# Patient Record
Sex: Female | Born: 1960 | Race: White | Hispanic: No | Marital: Married | Smoking: Former smoker
Health system: Southern US, Community
[De-identification: ages and names within clinical notes are randomized; demographics above are authoritative.]

## PROBLEM LIST (undated history)

## (undated) DIAGNOSIS — T7840XA Allergy, unspecified, initial encounter: Secondary | ICD-10-CM

## (undated) DIAGNOSIS — K219 Gastro-esophageal reflux disease without esophagitis: Secondary | ICD-10-CM

## (undated) DIAGNOSIS — W5911XA Bitten by nonvenomous snake, initial encounter: Secondary | ICD-10-CM

## (undated) HISTORY — DX: Gastro-esophageal reflux disease without esophagitis: K21.9

## (undated) HISTORY — DX: Allergy, unspecified, initial encounter: T78.40XA

## (undated) HISTORY — DX: Bitten by nonvenomous snake, initial encounter: W59.11XA

## (undated) HISTORY — PX: DILATION AND CURETTAGE OF UTERUS: SHX78

---

## 1999-09-30 ENCOUNTER — Other Ambulatory Visit: Admission: RE | Admit: 1999-09-30 | Discharge: 1999-09-30 | Payer: Self-pay | Admitting: Obstetrics & Gynecology

## 2000-04-07 ENCOUNTER — Inpatient Hospital Stay (HOSPITAL_COMMUNITY): Admission: AD | Admit: 2000-04-07 | Discharge: 2000-04-10 | Payer: Self-pay | Admitting: Obstetrics & Gynecology

## 2000-05-15 ENCOUNTER — Other Ambulatory Visit: Admission: RE | Admit: 2000-05-15 | Discharge: 2000-05-15 | Payer: Self-pay | Admitting: Obstetrics & Gynecology

## 2001-08-15 ENCOUNTER — Other Ambulatory Visit: Admission: RE | Admit: 2001-08-15 | Discharge: 2001-08-15 | Payer: Self-pay | Admitting: Obstetrics & Gynecology

## 2002-10-09 ENCOUNTER — Other Ambulatory Visit: Admission: RE | Admit: 2002-10-09 | Discharge: 2002-10-09 | Payer: Self-pay | Admitting: Obstetrics & Gynecology

## 2007-10-28 ENCOUNTER — Emergency Department (HOSPITAL_COMMUNITY): Admission: EM | Admit: 2007-10-28 | Discharge: 2007-10-28 | Payer: Self-pay | Admitting: Emergency Medicine

## 2007-11-04 ENCOUNTER — Encounter: Admission: RE | Admit: 2007-11-04 | Discharge: 2007-11-04 | Payer: Self-pay | Admitting: Orthopedic Surgery
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
## 2009-09-28 ENCOUNTER — Other Ambulatory Visit: Admission: RE | Admit: 2009-09-28 | Discharge: 2009-09-28 | Payer: Self-pay | Admitting: Obstetrics and Gynecology

## 2010-12-05 ENCOUNTER — Other Ambulatory Visit: Payer: Self-pay | Admitting: Family Medicine

## 2010-12-05 DIAGNOSIS — Z1231 Encounter for screening mammogram for malignant neoplasm of breast: Secondary | ICD-10-CM

## 2010-12-15 ENCOUNTER — Ambulatory Visit
Admission: RE | Admit: 2010-12-15 | Discharge: 2010-12-15 | Disposition: A | Discharge status: Home / Self Care | Payer: 59 | Source: Ambulatory Visit | Attending: Family Medicine | Admitting: Family Medicine

## 2010-12-15 DIAGNOSIS — Z1231 Encounter for screening mammogram for malignant neoplasm of breast: Secondary | ICD-10-CM

## 2013-06-20 ENCOUNTER — Other Ambulatory Visit: Payer: Self-pay

## 2013-06-20 DIAGNOSIS — Z1231 Encounter for screening mammogram for malignant neoplasm of breast: Secondary | ICD-10-CM

## 2013-07-22 ENCOUNTER — Ambulatory Visit: Payer: 59

## 2013-08-22 ENCOUNTER — Ambulatory Visit
Admission: RE | Admit: 2013-08-22 | Discharge: 2013-08-22 | Disposition: A | Discharge status: Home / Self Care | Payer: Managed Care, Other (non HMO) | Source: Ambulatory Visit

## 2013-08-22 DIAGNOSIS — Z1231 Encounter for screening mammogram for malignant neoplasm of breast: Secondary | ICD-10-CM

## 2014-08-17 ENCOUNTER — Other Ambulatory Visit: Payer: Self-pay

## 2014-08-17 DIAGNOSIS — Z1231 Encounter for screening mammogram for malignant neoplasm of breast: Secondary | ICD-10-CM

## 2014-08-26 ENCOUNTER — Ambulatory Visit
Admission: RE | Admit: 2014-08-26 | Discharge: 2014-08-26 | Disposition: A | Discharge status: Home / Self Care | Payer: 59 | Source: Ambulatory Visit

## 2014-08-26 DIAGNOSIS — Z1231 Encounter for screening mammogram for malignant neoplasm of breast: Secondary | ICD-10-CM

## 2015-09-09 ENCOUNTER — Other Ambulatory Visit: Payer: Self-pay

## 2015-09-09 DIAGNOSIS — Z1231 Encounter for screening mammogram for malignant neoplasm of breast: Secondary | ICD-10-CM

## 2015-09-17 ENCOUNTER — Ambulatory Visit
Admission: RE | Admit: 2015-09-17 | Discharge: 2015-09-17 | Disposition: A | Discharge status: Home / Self Care | Payer: 59 | Source: Ambulatory Visit

## 2015-09-17 DIAGNOSIS — Z1231 Encounter for screening mammogram for malignant neoplasm of breast: Secondary | ICD-10-CM

## 2015-09-21 ENCOUNTER — Other Ambulatory Visit: Payer: Self-pay | Admitting: Family Medicine

## 2015-09-21 DIAGNOSIS — R928 Other abnormal and inconclusive findings on diagnostic imaging of breast: Secondary | ICD-10-CM

## 2015-09-23 ENCOUNTER — Other Ambulatory Visit: Payer: Self-pay | Admitting: Family Medicine

## 2015-09-23 DIAGNOSIS — R928 Other abnormal and inconclusive findings on diagnostic imaging of breast: Secondary | ICD-10-CM

## 2015-09-24 ENCOUNTER — Other Ambulatory Visit: Payer: 59

## 2015-09-27 ENCOUNTER — Encounter: Payer: Self-pay | Admitting: Family Medicine

## 2015-09-27 ENCOUNTER — Ambulatory Visit (INDEPENDENT_AMBULATORY_CARE_PROVIDER_SITE_OTHER): Payer: 59 | Admitting: Family Medicine

## 2015-09-27 VITALS — BP 102/70 | HR 73 | Temp 97.8°F | Resp 20 | Ht 67.0 in | Wt 160.2 lb

## 2015-09-27 DIAGNOSIS — R928 Other abnormal and inconclusive findings on diagnostic imaging of breast: Secondary | ICD-10-CM | POA: Insufficient documentation

## 2015-09-27 DIAGNOSIS — Z7189 Other specified counseling: Secondary | ICD-10-CM | POA: Diagnosis not present

## 2015-09-27 DIAGNOSIS — Z7689 Persons encountering health services in other specified circumstances: Secondary | ICD-10-CM | POA: Insufficient documentation

## 2015-09-27 NOTE — Patient Instructions (Signed)
Health Maintenance, Female Adopting a healthy lifestyle and getting preventive care can go a long way to promote health and wellness. Talk with your health care provider about what schedule of regular examinations is right for you. This is a good chance for you to check in with your provider about disease prevention and staying healthy. In between checkups, there are plenty of things you can do on your own. Experts have done a lot of research about which lifestyle changes and preventive measures are most likely to keep you healthy. Ask your health care provider for more information. WEIGHT AND DIET  Eat a healthy diet  Be sure to include plenty of vegetables, fruits, low-fat dairy products, and lean protein.  Do not eat a lot of foods high in solid fats, added sugars, or salt.  Get regular exercise. This is one of the most important things you can do for your health.  Most adults should exercise for at least 150 minutes each week. The exercise should increase your heart rate and make you sweat (moderate-intensity exercise).  Most adults should also do strengthening exercises at least twice a week. This is in addition to the moderate-intensity exercise.  Maintain a healthy weight  Body mass index (BMI) is a measurement that can be used to identify possible weight problems. It estimates body fat based on height and weight. Your health care provider can help determine your BMI and help you achieve or maintain a healthy weight.  For females 20 years of age and older:   A BMI below 18.5 is considered underweight.  A BMI of 18.5 to 24.9 is normal.  A BMI of 25 to 29.9 is considered overweight.  A BMI of 30 and above is considered obese.  Watch levels of cholesterol and blood lipids  You should start having your blood tested for lipids and cholesterol at 55 years of age, then have this test every 5 years.  You may need to have your cholesterol levels checked more often if:  Your lipid  or cholesterol levels are high.  You are older than 55 years of age.  You are at high risk for heart disease.  CANCER SCREENING   Lung Cancer  Lung cancer screening is recommended for adults 55-80 years old who are at high risk for lung cancer because of a history of smoking.  A yearly low-dose CT scan of the lungs is recommended for people who:  Currently smoke.  Have quit within the past 15 years.  Have at least a 30-pack-year history of smoking. A pack year is smoking an average of one pack of cigarettes a day for 1 year.  Yearly screening should continue until it has been 15 years since you quit.  Yearly screening should stop if you develop a health problem that would prevent you from having lung cancer treatment.  Breast Cancer  Practice breast self-awareness. This means understanding how your breasts normally appear and feel.  It also means doing regular breast self-exams. Let your health care provider know about any changes, no matter how small.  If you are in your 20s or 30s, you should have a clinical breast exam (CBE) by a health care provider every 1-3 years as part of a regular health exam.  If you are 40 or older, have a CBE every year. Also consider having a breast X-ray (mammogram) every year.  If you have a family history of breast cancer, talk to your health care provider about genetic screening.  If you   are at high risk for breast cancer, talk to your health care provider about having an MRI and a mammogram every year.  Breast cancer gene (BRCA) assessment is recommended for women who have family members with BRCA-related cancers. BRCA-related cancers include:  Breast.  Ovarian.  Tubal.  Peritoneal cancers.  Results of the assessment will determine the need for genetic counseling and BRCA1 and BRCA2 testing. Cervical Cancer Your health care provider may recommend that you be screened regularly for cancer of the pelvic organs (ovaries, uterus, and  vagina). This screening involves a pelvic examination, including checking for microscopic changes to the surface of your cervix (Pap test). You may be encouraged to have this screening done every 3 years, beginning at age 21.  For women ages 30-65, health care providers may recommend pelvic exams and Pap testing every 3 years, or they may recommend the Pap and pelvic exam, combined with testing for human papilloma virus (HPV), every 5 years. Some types of HPV increase your risk of cervical cancer. Testing for HPV may also be done on women of any age with unclear Pap test results.  Other health care providers may not recommend any screening for nonpregnant women who are considered low risk for pelvic cancer and who do not have symptoms. Ask your health care provider if a screening pelvic exam is right for you.  If you have had past treatment for cervical cancer or a condition that could lead to cancer, you need Pap tests and screening for cancer for at least 20 years after your treatment. If Pap tests have been discontinued, your risk factors (such as having a new sexual partner) need to be reassessed to determine if screening should resume. Some women have medical problems that increase the chance of getting cervical cancer. In these cases, your health care provider may recommend more frequent screening and Pap tests. Colorectal Cancer  This type of cancer can be detected and often prevented.  Routine colorectal cancer screening usually begins at 55 years of age and continues through 55 years of age.  Your health care provider may recommend screening at an earlier age if you have risk factors for colon cancer.  Your health care provider may also recommend using home test kits to check for hidden blood in the stool.  A small camera at the end of a tube can be used to examine your colon directly (sigmoidoscopy or colonoscopy). This is done to check for the earliest forms of colorectal  cancer.  Routine screening usually begins at age 50.  Direct examination of the colon should be repeated every 5-10 years through 55 years of age. However, you may need to be screened more often if early forms of precancerous polyps or small growths are found. Skin Cancer  Check your skin from head to toe regularly.  Tell your health care provider about any new moles or changes in moles, especially if there is a change in a mole's shape or color.  Also tell your health care provider if you have a mole that is larger than the size of a pencil eraser.  Always use sunscreen. Apply sunscreen liberally and repeatedly throughout the day.  Protect yourself by wearing long sleeves, pants, a wide-brimmed hat, and sunglasses whenever you are outside. HEART DISEASE, DIABETES, AND HIGH BLOOD PRESSURE   High blood pressure causes heart disease and increases the risk of stroke. High blood pressure is more likely to develop in:  People who have blood pressure in the high end   of the normal range (130-139/85-89 mm Hg).  People who are overweight or obese.  People who are African American.  If you are 38-23 years of age, have your blood pressure checked every 3-5 years. If you are 61 years of age or older, have your blood pressure checked every year. You should have your blood pressure measured twice--once when you are at a hospital or clinic, and once when you are not at a hospital or clinic. Record the average of the two measurements. To check your blood pressure when you are not at a hospital or clinic, you can use:  An automated blood pressure machine at a pharmacy.  A home blood pressure monitor.  If you are between 45 years and 39 years old, ask your health care provider if you should take aspirin to prevent strokes.  Have regular diabetes screenings. This involves taking a blood sample to check your fasting blood sugar level.  If you are at a normal weight and have a low risk for diabetes,  have this test once every three years after 55 years of age.  If you are overweight and have a high risk for diabetes, consider being tested at a younger age or more often. PREVENTING INFECTION  Hepatitis B  If you have a higher risk for hepatitis B, you should be screened for this virus. You are considered at high risk for hepatitis B if:  You were born in a country where hepatitis B is common. Ask your health care provider which countries are considered high risk.  Your parents were born in a high-risk country, and you have not been immunized against hepatitis B (hepatitis B vaccine).  You have HIV or AIDS.  You use needles to inject street drugs.  You live with someone who has hepatitis B.  You have had sex with someone who has hepatitis B.  You get hemodialysis treatment.  You take certain medicines for conditions, including cancer, organ transplantation, and autoimmune conditions. Hepatitis C  Blood testing is recommended for:  Everyone born from [birth year] through 1965.  Anyone with known risk factors for hepatitis C. Sexually transmitted infections (STIs)  You should be screened for sexually transmitted infections (STIs) including gonorrhea and chlamydia if:  You are sexually active and are younger than 55 years of age.  You are older than 55 years of age and your health care provider tells you that you are at risk for this type of infection.  Your sexual activity has changed since you were last screened and you are at an increased risk for chlamydia or gonorrhea. Ask your health care provider if you are at risk.  If you do not have HIV, but are at risk, it may be recommended that you take a prescription medicine daily to prevent HIV infection. This is called pre-exposure prophylaxis (PrEP). You are considered at risk if:  You are sexually active and do not regularly use condoms or know the HIV status of your partner(s).  You take drugs by injection.  You are sexually  active with a partner who has HIV. Talk with your health care provider about whether you are at high risk of being infected with HIV. If you choose to begin PrEP, you should first be tested for HIV. You should then be tested every 3 months for as long as you are taking PrEP.  PREGNANCY   If you are premenopausal and you may become pregnant, ask your health care provider about preconception counseling.  If you may  become pregnant, take 400 to 800 micrograms (mcg) of folic acid every day.  If you want to prevent pregnancy, talk to your health care provider about birth control (contraception). OSTEOPOROSIS AND MENOPAUSE   Osteoporosis is a disease in which the bones lose minerals and strength with aging. This can result in serious bone fractures. Your risk for osteoporosis can be identified using a bone density scan.  If you are 61 years of age or older, or if you are at risk for osteoporosis and fractures, ask your health care provider if you should be screened.  Ask your health care provider whether you should take a calcium or vitamin D supplement to lower your risk for osteoporosis.  Menopause may have certain physical symptoms and risks.  Hormone replacement therapy may reduce some of these symptoms and risks. Talk to your health care provider about whether hormone replacement therapy is right for you.  HOME CARE INSTRUCTIONS   Schedule regular health, dental, and eye exams.  Stay current with your immunizations.   Do not use any tobacco products including cigarettes, chewing tobacco, or electronic cigarettes.  If you are pregnant, do not drink alcohol.  If you are breastfeeding, limit how much and how often you drink alcohol.  Limit alcohol intake to no more than 1 drink per day for nonpregnant women. One drink equals 12 ounces of beer, 5 ounces of wine, or 1 ounces of hard liquor.  Do not use street drugs.  Do not share needles.  Ask your health care provider for help if  you need support or information about quitting drugs.  Tell your health care provider if you often feel depressed.  Tell your health care provider if you have ever been abused or do not feel safe at home.   This information is not intended to replace advice given to you by your health care provider. Make sure you discuss any questions you have with your health care provider.   Document Released: 02/13/2011 Document Revised: 08/21/2014 Document Reviewed: 07/02/2013 Elsevier Interactive Patient Education [employer].

## 2015-09-27 NOTE — Progress Notes (Addendum)
Patient ID: Anneth Brunell, female   DOB: Aug 24, 1960, 55 y.o.   MRN: [id number]      Patient ID: Story Conti, female  DOB: 11-Jun-1961, 55 y.o.   MRN: [id number]  Subjective:  Vastie Douty is a 55 y.o. female present for establishment of care with abnormal mammogram.  All past medical history, surgical history, allergies, family history, immunizations, medications and social history were obtained and entered in the electronic medical record today. All recent labs, ED visits and hospitalizations within the last year were reviewed.   Health maintenance:  Colonoscopy: 09/2010, polyps (5 year recommended) due this year ([facility]). Mammogram: Recent abnl, scheduled in 2 days for further eval on right breast. No prior irregular mammograms.  Cervical cancer screening: PAP 2014, Normal.  Immunizations: Flu (2016) and tdap UTD (2014) Infectious disease screening: indicated DEXA: routine screen 65   Past Medical History  Diagnosis Date  . Snake bite   . Allergy   . GERD (gastroesophageal reflux disease)    Allergies  Allergen Reactions  . Sulfamethoxazole Other (See Comments)    Unknown   Past Surgical History  Procedure Laterality Date  . Dilation and curettage of uterus     Family History  Problem Relation Age of Onset  . Alzheimer's disease Mother   . Heart disease Father   . Cancer Father     esophageal  . Diabetes Father   . Hyperlipidemia Brother   . Breast cancer Paternal Aunt    Social History   Social History  . Marital Status: Married    Spouse Name: N/A  . Number of Children: N/A  . Years of Education: N/A   Occupational History  . Not on file.   Social History Main Topics  . Smoking status: Former Games developer  . Smokeless tobacco: Not on file  . Alcohol Use: 1.2 - 1.8 oz/week    2-3 Glasses of wine per week  . Drug Use: No  . Sexual Activity: Yes    Birth Control/ Protection: Post-menopausal   Other Topics Concern  . Not on file   Social  History Narrative   Married to [employer]. # children Molli Hazard, Donna Christen)    Former nurse.    Former smoker. Occasional Wine. No drugs   Drinks caffeine.   Wears her seatbelt, wears bicycle helmet, Exercises 3x a week   Smoke detector in the home.    Feels safe in her relationships.    ROS: Negative, with the exception of above mentioned in HPI  Objective: BP 102/70 mmHg  Pulse 73  Temp(Src) 97.8 F (36.6 C)  Resp 20  Ht  (1.702 m)  Wt 160 lb 4 oz (72.689 kg)  BMI 25.09 kg/m2  SpO2 99%  LMP 11/23/2010 Gen: Afebrile. No acute distress. Nontoxic in appearance,  well-developed, well-nourished, female, pleasant  HENT: AT. [place]. . MMM, No Cough on exam Eyes:Pupils Equal Round Reactive to light, Extraocular movements intact,  Conjunctiva without redness, discharge or icterus. Neck/lymp/endocrine: Supple,No  lymphadenopathy, No thyromegaly CV: RRR no murmur, No edema, +2/4 P posterior tibialis pulses.  Chest: CTAB, no wheeze, rhonchi or crackles.  Abd: Soft. flat. NTND. BS present. No Masses palpated. No hepatosplenomegaly. No rebound tenderness or guarding. Skin: No rashes, purpura or petechiae.  Neuro/Msk: Normal gait. PERLA. EOMi. Alert. Oriented x3.   Psych: Normal affect, dress and demeanor. Normal speech. Normal thought content and judgment. Assessment/plan: Elexa Kivi is a 55 y.o. female  present to establish care.  Abnormal mammogram of right breast -  No prior ABNL mammogram.  - faxed orders this morning for [place].   F/U CPE/labs --> 3-4 months  No Follow-up on file.   Felix Pacini, DO [facility] [place]

## 2015-09-29 ENCOUNTER — Ambulatory Visit
Admission: RE | Admit: 2015-09-29 | Discharge: 2015-09-29 | Disposition: A | Discharge status: Home / Self Care | Payer: 59 | Source: Ambulatory Visit | Attending: Family Medicine | Admitting: Family Medicine

## 2015-09-29 DIAGNOSIS — R928 Other abnormal and inconclusive findings on diagnostic imaging of breast: Secondary | ICD-10-CM

## 2015-12-07 ENCOUNTER — Encounter: Payer: 59 | Admitting: Family Medicine

## 2015-12-27 ENCOUNTER — Encounter: Payer: Self-pay | Admitting: Family Medicine

## 2015-12-27 ENCOUNTER — Ambulatory Visit (INDEPENDENT_AMBULATORY_CARE_PROVIDER_SITE_OTHER): Payer: 59 | Admitting: Family Medicine

## 2015-12-27 VITALS — BP 109/74 | HR 76 | Temp 98.1°F | Resp 20 | Ht 67.0 in | Wt 152.8 lb

## 2015-12-27 DIAGNOSIS — Z1211 Encounter for screening for malignant neoplasm of colon: Secondary | ICD-10-CM

## 2015-12-27 DIAGNOSIS — Z13220 Encounter for screening for lipoid disorders: Secondary | ICD-10-CM

## 2015-12-27 DIAGNOSIS — Z86010 Personal history of colonic polyps: Secondary | ICD-10-CM | POA: Diagnosis not present

## 2015-12-27 DIAGNOSIS — Z1159 Encounter for screening for other viral diseases: Secondary | ICD-10-CM

## 2015-12-27 DIAGNOSIS — Z114 Encounter for screening for human immunodeficiency virus [HIV]: Secondary | ICD-10-CM

## 2015-12-27 DIAGNOSIS — Z130 Encounter for screening for diseases of the blood and blood-forming organs and certain disorders involving the immune mechanism: Secondary | ICD-10-CM

## 2015-12-27 DIAGNOSIS — Z1329 Encounter for screening for other suspected endocrine disorder: Secondary | ICD-10-CM

## 2015-12-27 DIAGNOSIS — Z0000 Encounter for general adult medical examination without abnormal findings: Secondary | ICD-10-CM | POA: Insufficient documentation

## 2015-12-27 DIAGNOSIS — B070 Plantar wart: Secondary | ICD-10-CM | POA: Diagnosis not present

## 2015-12-27 DIAGNOSIS — Z8249 Family history of ischemic heart disease and other diseases of the circulatory system: Secondary | ICD-10-CM

## 2015-12-27 DIAGNOSIS — Z131 Encounter for screening for diabetes mellitus: Secondary | ICD-10-CM

## 2015-12-27 NOTE — Patient Instructions (Signed)
Health Maintenance, Female Adopting a healthy lifestyle and getting preventive care can go a long way to promote health and wellness. Talk with your health care provider about what schedule of regular examinations is right for you. This is a good chance for you to check in with your provider about disease prevention and staying healthy. In between checkups, there are plenty of things you can do on your own. Experts have done a lot of research about which lifestyle changes and preventive measures are most likely to keep you healthy. Ask your health care provider for more information. WEIGHT AND DIET  Eat a healthy diet  Be sure to include plenty of vegetables, fruits, low-fat dairy products, and lean protein.  Do not eat a lot of foods high in solid fats, added sugars, or salt.  Get regular exercise. This is one of the most important things you can do for your health.  Most adults should exercise for at least 150 minutes each week. The exercise should increase your heart rate and make you sweat (moderate-intensity exercise).  Most adults should also do strengthening exercises at least twice a week. This is in addition to the moderate-intensity exercise.  Maintain a healthy weight  Body mass index (BMI) is a measurement that can be used to identify possible weight problems. It estimates body fat based on height and weight. Your health care provider can help determine your BMI and help you achieve or maintain a healthy weight.  For females 20 years of age and older:   A BMI below 18.5 is considered underweight.  A BMI of 18.5 to 24.9 is normal.  A BMI of 25 to 29.9 is considered overweight.  A BMI of 30 and above is considered obese.  Watch levels of cholesterol and blood lipids  You should start having your blood tested for lipids and cholesterol at 55 years of age, then have this test every 5 years.  You may need to have your cholesterol levels checked more often if:  Your lipid  or cholesterol levels are high.  You are older than 55 years of age.  You are at high risk for heart disease.  CANCER SCREENING   Lung Cancer  Lung cancer screening is recommended for adults 55-80 years old who are at high risk for lung cancer because of a history of smoking.  A yearly low-dose CT scan of the lungs is recommended for people who:  Currently smoke.  Have quit within the past 15 years.  Have at least a 30-pack-year history of smoking. A pack year is smoking an average of one pack of cigarettes a day for 1 year.  Yearly screening should continue until it has been 15 years since you quit.  Yearly screening should stop if you develop a health problem that would prevent you from having lung cancer treatment.  Breast Cancer  Practice breast self-awareness. This means understanding how your breasts normally appear and feel.  It also means doing regular breast self-exams. Let your health care provider know about any changes, no matter how small.  If you are in your 20s or 30s, you should have a clinical breast exam (CBE) by a health care provider every 1-3 years as part of a regular health exam.  If you are 40 or older, have a CBE every year. Also consider having a breast X-ray (mammogram) every year.  If you have a family history of breast cancer, talk to your health care provider about genetic screening.  If you   are at high risk for breast cancer, talk to your health care provider about having an MRI and a mammogram every year.  Breast cancer gene (BRCA) assessment is recommended for women who have family members with BRCA-related cancers. BRCA-related cancers include:  Breast.  Ovarian.  Tubal.  Peritoneal cancers.  Results of the assessment will determine the need for genetic counseling and BRCA1 and BRCA2 testing. Cervical Cancer Your health care provider may recommend that you be screened regularly for cancer of the pelvic organs (ovaries, uterus, and  vagina). This screening involves a pelvic examination, including checking for microscopic changes to the surface of your cervix (Pap test). You may be encouraged to have this screening done every 3 years, beginning at age 21.  For women ages 30-65, health care providers may recommend pelvic exams and Pap testing every 3 years, or they may recommend the Pap and pelvic exam, combined with testing for human papilloma virus (HPV), every 5 years. Some types of HPV increase your risk of cervical cancer. Testing for HPV may also be done on women of any age with unclear Pap test results.  Other health care providers may not recommend any screening for nonpregnant women who are considered low risk for pelvic cancer and who do not have symptoms. Ask your health care provider if a screening pelvic exam is right for you.  If you have had past treatment for cervical cancer or a condition that could lead to cancer, you need Pap tests and screening for cancer for at least 20 years after your treatment. If Pap tests have been discontinued, your risk factors (such as having a new sexual partner) need to be reassessed to determine if screening should resume. Some women have medical problems that increase the chance of getting cervical cancer. In these cases, your health care provider may recommend more frequent screening and Pap tests. Colorectal Cancer  This type of cancer can be detected and often prevented.  Routine colorectal cancer screening usually begins at 55 years of age and continues through 55 years of age.  Your health care provider may recommend screening at an earlier age if you have risk factors for colon cancer.  Your health care provider may also recommend using home test kits to check for hidden blood in the stool.  A small camera at the end of a tube can be used to examine your colon directly (sigmoidoscopy or colonoscopy). This is done to check for the earliest forms of colorectal  cancer.  Routine screening usually begins at age 50.  Direct examination of the colon should be repeated every 5-10 years through 55 years of age. However, you may need to be screened more often if early forms of precancerous polyps or small growths are found. Skin Cancer  Check your skin from head to toe regularly.  Tell your health care provider about any new moles or changes in moles, especially if there is a change in a mole's shape or color.  Also tell your health care provider if you have a mole that is larger than the size of a pencil eraser.  Always use sunscreen. Apply sunscreen liberally and repeatedly throughout the day.  Protect yourself by wearing long sleeves, pants, a wide-brimmed hat, and sunglasses whenever you are outside. HEART DISEASE, DIABETES, AND HIGH BLOOD PRESSURE   High blood pressure causes heart disease and increases the risk of stroke. High blood pressure is more likely to develop in:  People who have blood pressure in the high end   of the normal range (130-139/85-89 mm Hg).  People who are overweight or obese.  People who are African American.  If you are 38-23 years of age, have your blood pressure checked every 3-5 years. If you are 61 years of age or older, have your blood pressure checked every year. You should have your blood pressure measured twice--once when you are at a hospital or clinic, and once when you are not at a hospital or clinic. Record the average of the two measurements. To check your blood pressure when you are not at a hospital or clinic, you can use:  An automated blood pressure machine at a pharmacy.  A home blood pressure monitor.  If you are between 45 years and 39 years old, ask your health care provider if you should take aspirin to prevent strokes.  Have regular diabetes screenings. This involves taking a blood sample to check your fasting blood sugar level.  If you are at a normal weight and have a low risk for diabetes,  have this test once every three years after 55 years of age.  If you are overweight and have a high risk for diabetes, consider being tested at a younger age or more often. PREVENTING INFECTION  Hepatitis B  If you have a higher risk for hepatitis B, you should be screened for this virus. You are considered at high risk for hepatitis B if:  You were born in a country where hepatitis B is common. Ask your health care provider which countries are considered high risk.  Your parents were born in a high-risk country, and you have not been immunized against hepatitis B (hepatitis B vaccine).  You have HIV or AIDS.  You use needles to inject street drugs.  You live with someone who has hepatitis B.  You have had sex with someone who has hepatitis B.  You get hemodialysis treatment.  You take certain medicines for conditions, including cancer, organ transplantation, and autoimmune conditions. Hepatitis C  Blood testing is recommended for:  Everyone born from [birth year] through 1965.  Anyone with known risk factors for hepatitis C. Sexually transmitted infections (STIs)  You should be screened for sexually transmitted infections (STIs) including gonorrhea and chlamydia if:  You are sexually active and are younger than 55 years of age.  You are older than 55 years of age and your health care provider tells you that you are at risk for this type of infection.  Your sexual activity has changed since you were last screened and you are at an increased risk for chlamydia or gonorrhea. Ask your health care provider if you are at risk.  If you do not have HIV, but are at risk, it may be recommended that you take a prescription medicine daily to prevent HIV infection. This is called pre-exposure prophylaxis (PrEP). You are considered at risk if:  You are sexually active and do not regularly use condoms or know the HIV status of your partner(s).  You take drugs by injection.  You are sexually  active with a partner who has HIV. Talk with your health care provider about whether you are at high risk of being infected with HIV. If you choose to begin PrEP, you should first be tested for HIV. You should then be tested every 3 months for as long as you are taking PrEP.  PREGNANCY   If you are premenopausal and you may become pregnant, ask your health care provider about preconception counseling.  If you may  become pregnant, take 400 to 800 micrograms (mcg) of folic acid every day.  If you want to prevent pregnancy, talk to your health care provider about birth control (contraception). OSTEOPOROSIS AND MENOPAUSE   Osteoporosis is a disease in which the bones lose minerals and strength with aging. This can result in serious bone fractures. Your risk for osteoporosis can be identified using a bone density scan.  If you are 61 years of age or older, or if you are at risk for osteoporosis and fractures, ask your health care provider if you should be screened.  Ask your health care provider whether you should take a calcium or vitamin D supplement to lower your risk for osteoporosis.  Menopause may have certain physical symptoms and risks.  Hormone replacement therapy may reduce some of these symptoms and risks. Talk to your health care provider about whether hormone replacement therapy is right for you.  HOME CARE INSTRUCTIONS   Schedule regular health, dental, and eye exams.  Stay current with your immunizations.   Do not use any tobacco products including cigarettes, chewing tobacco, or electronic cigarettes.  If you are pregnant, do not drink alcohol.  If you are breastfeeding, limit how much and how often you drink alcohol.  Limit alcohol intake to no more than 1 drink per day for nonpregnant women. One drink equals 12 ounces of beer, 5 ounces of wine, or 1 ounces of hard liquor.  Do not use street drugs.  Do not share needles.  Ask your health care provider for help if  you need support or information about quitting drugs.  Tell your health care provider if you often feel depressed.  Tell your health care provider if you have ever been abused or do not feel safe at home.   This information is not intended to replace advice given to you by your health care provider. Make sure you discuss any questions you have with your health care provider.   Document Released: 02/13/2011 Document Revised: 08/21/2014 Document Reviewed: 07/02/2013 Elsevier Interactive Patient Education [employer].

## 2015-12-27 NOTE — Progress Notes (Signed)
Patient ID: Frederick PeersMargaret Shull, female   DOB: Jan 07, 1961, 55 y.o.   MRN: [id number]      Patient ID: Frederick PeersMargaret Finlay, female  DOB: Jan 07, 1961, 55 y.o.   MRN: [id number]  Subjective:  Frederick PeersMargaret Osland is a 55 y.o. female present for annual exam.  All past medical history, surgical history, allergies, family history, immunizations, medications and social history were updated and entered  in the electronic medical record today. All recent labs, ED visits and hospitalizations within the last year were reviewed.  Plantar warts: Patient complains of bilateral plantar warts which she states have been on her feet for approximately one year are worsening. She has attempted over-the-counter treatments and does not feel she is making any progress. She has been established with dermatology in the past, and like to return to the same dermatologist if possible Dr. Amy SwazilandJordan[facility]   Health maintenance:  Colonoscopy: 09/2010, polyps (5 year recommended) due this year Deboraha Sprang([facility]). Mammogram: Recent abnl, further imaging studies prove to be benign findings. Yearly mammograms indicated.  Cervical cancer screening: PAP 2014, Normal. No HPV co-testing, PAP indicated within 1 year.  Immunizations: Flu (2016) and tdap UTD (2014) Infectious disease screening: Completed today DEXA: routine screen 65, MGM osteoporosis, estrogen deficient. Caucasian, non-smoker, caucasian.  Assistive device: None  Oxygen use: None  Patient has a Dental home. Hospitalizations/ED visits: None   Past Medical History  Diagnosis Date  . Snake bite   . Allergy   . GERD (gastroesophageal reflux disease)    Allergies  Allergen Reactions  . Sulfamethoxazole Other (See Comments)    Unknown   Past Surgical History  Procedure Laterality Date  . Dilation and curettage of uterus     Family History  Problem Relation Age of Onset  . Alzheimer's disease Mother   . Heart disease Father   . Cancer Father     esophageal  .  Diabetes Father   . Hyperlipidemia Brother   . Breast cancer Paternal Aunt    Social History   Social History  . Marital Status: Married    Spouse Name: N/A  . Number of Children: N/A  . Years of Education: N/A   Occupational History  . Not on file.   Social History Main Topics  . Smoking status: Former Games developermoker  . Smokeless tobacco: Not on file  . Alcohol Use: 1.2 - 1.8 oz/week    2-3 Glasses of wine per week  . Drug Use: No  . Sexual Activity: Yes    Birth Control/ Protection: Post-menopausal   Other Topics Concern  . Not on file   Social History Narrative   Married to [employer]. # children Molli Hazard(Matthew, Donna Christenhristopher, Kaitlyn)    Former nurse.    Former smoker. Occasional Wine. No drugs   Drinks caffeine.   Wears her seatbelt, wears bicycle helmet, Exercises 3x a week   Smoke detector in the home.    Feels safe in her relationships.      ROS: Negative, with the exception of above mentioned in HPI  Objective: BP 109/74 mmHg  Pulse 76  Temp(Src) 98.1 F (36.7 C) (Oral)  Resp 20  Ht 5\' 7"  (1.702 m)  Wt 152 lb 12 oz (69.287 kg)  BMI 23.92 kg/m2  SpO2 97%  LMP 11/23/2010 Gen: Afebrile. No acute distress. Nontoxic in appearance, well-developed, well-nourished,Occasion female, Very pleasant HENT: AT. [place]. Bilateral TM visualized and normal in appearance, normal external auditory canal. MMM, no oral lesions, good dentition. Bilateral nares without erythema or swelling. Throat  without erythema, ulcerations or exudates. No Cough on exam, no hoarseness on exam. Eyes:Pupils Equal Round Reactive to light, Extraocular movements intact,  Conjunctiva without redness, discharge or icterus. Neck/lymp/endocrine: Supple, no lymphadenopathy, mildly enlarged thyromegaly CV: RRR no murmur appreciated, no edema, +2/4 P posterior tibialis pulses.  Chest: CTAB, no wheeze, rhonchi or crackles. Normal Respiratory effort. Good Air movement. Abd: Soft. Flat. NTND. BS present. No Masses palpated.  No hepatosplenomegaly. No rebound tenderness or guarding. Skin: No rashes, purpura or petechiae. Warm and well-perfused. Skin intact. Neuro/Msk:  Normal gait. PERLA. EOMi. Alert. Oriented x3.  Cranial nerves II through XII intact. Muscle strength 5/5 upper and lower extremity. DTRs equal bilaterally. Psych: Normal affect, dress and demeanor. Normal speech. Normal thought content and judgment. Rt heel left fprefoot.   Assessment/plan: Soyla Bainter is a 55 y.o. female present for annual exam.  Encounter for preventive health examination - CBC w/Diff; Future - Comprehensive metabolic panel; Future - Lipid panel; Future - TSH; Future - AVS completed on age/gender appropriate health maintenance information. Patient was encouraged to take vitamin D daily. Exercise greater than 150 minutes a week. Diet full of fresh fruits and vegetables, lean meats.  Colon cancer screening/h/o colon polyps - Ambulatory referral to Gastroenterology  Plantar warts - pt has tried OTC treatment for about a year. She has had a dermatologist Dr. Swaziland ( [facility]) in the past. Discussed treatment through derm, including cryotherapy.  - Ambulatory referral to Dermatology  FH: heart disease/cholesterol screening  - Lipid panel; Future   Screening, anemia, deficiency, iron - CBC w/Diff; Future  Thyroid disorder screening - TSH; Future  Screening cholesterol level/diabetes screen - TSH; Future - A1c; future  Encounter for screening for HIV/Need for hepatitis C screening test Pt amendable to infectious disease screening  - HIV antibody (with reflex); Future - Hepatitis C Antibody; Future  Return in about 1 year (around 12/26/2016) for CPE with pap .  Electronically signed by: Felix Pacini, DO [facility] [place]

## 2015-12-28 ENCOUNTER — Other Ambulatory Visit (INDEPENDENT_AMBULATORY_CARE_PROVIDER_SITE_OTHER): Payer: 59

## 2015-12-28 DIAGNOSIS — Z1211 Encounter for screening for malignant neoplasm of colon: Secondary | ICD-10-CM

## 2015-12-28 DIAGNOSIS — Z130 Encounter for screening for diseases of the blood and blood-forming organs and certain disorders involving the immune mechanism: Secondary | ICD-10-CM

## 2015-12-28 DIAGNOSIS — Z0000 Encounter for general adult medical examination without abnormal findings: Secondary | ICD-10-CM | POA: Diagnosis not present

## 2015-12-28 DIAGNOSIS — Z114 Encounter for screening for human immunodeficiency virus [HIV]: Secondary | ICD-10-CM

## 2015-12-28 DIAGNOSIS — Z131 Encounter for screening for diabetes mellitus: Secondary | ICD-10-CM

## 2015-12-28 DIAGNOSIS — Z86010 Personal history of colonic polyps: Secondary | ICD-10-CM

## 2015-12-28 DIAGNOSIS — Z13220 Encounter for screening for lipoid disorders: Secondary | ICD-10-CM

## 2015-12-28 DIAGNOSIS — Z1329 Encounter for screening for other suspected endocrine disorder: Secondary | ICD-10-CM

## 2015-12-28 DIAGNOSIS — B070 Plantar wart: Secondary | ICD-10-CM

## 2015-12-28 DIAGNOSIS — Z1159 Encounter for screening for other viral diseases: Secondary | ICD-10-CM

## 2015-12-28 DIAGNOSIS — Z8249 Family history of ischemic heart disease and other diseases of the circulatory system: Secondary | ICD-10-CM

## 2015-12-28 LAB — COMPREHENSIVE METABOLIC PANEL
ALBUMIN: 4.4 g/dL (ref 3.5–5.2)
ALT: 10 U/L (ref 0–35)
AST: 16 U/L (ref 0–37)
Alkaline Phosphatase: 61 U/L (ref 39–117)
BUN: 22 mg/dL (ref 6–23)
CALCIUM: 9.9 mg/dL (ref 8.4–10.5)
CHLORIDE: 105 meq/L (ref 96–112)
CO2: 30 meq/L (ref 19–32)
Creatinine, Ser: 0.9 mg/dL (ref 0.40–1.20)
GFR: 69.07 mL/min (ref >60.00)
Glucose, Bld: 88 mg/dL (ref 70–99)
POTASSIUM: 4.5 meq/L (ref 3.5–5.1)
SODIUM: 141 meq/L (ref 135–145)
Total Bilirubin: 0.6 mg/dL (ref 0.2–1.2)
Total Protein: 6.7 g/dL (ref 6.0–8.3)

## 2015-12-28 LAB — CBC WITH DIFFERENTIAL/PLATELET
Basophils Absolute: 0 10*3/uL (ref 0.0–0.1)
Basophils Relative: 0.5 % (ref 0.0–3.0)
Eosinophils Absolute: 0.1 10*3/uL (ref 0.0–0.7)
Eosinophils Relative: 2.6 % (ref 0.0–5.0)
HEMATOCRIT: 39.7 % (ref 36.0–46.0)
HEMOGLOBIN: 13.4 g/dL (ref 12.0–15.0)
LYMPHS ABS: 1.5 10*3/uL (ref 0.7–4.0)
LYMPHS PCT: 41.4 % (ref 12.0–46.0)
MCHC: 33.8 g/dL (ref 30.0–36.0)
MCV: 91.8 fl (ref 78.0–100.0)
MONOS PCT: 8.1 % (ref 3.0–12.0)
Monocytes Absolute: 0.3 10*3/uL (ref 0.1–1.0)
Neutro Abs: 1.7 10*3/uL (ref 1.4–7.7)
Neutrophils Relative %: 47.4 % (ref 43.0–77.0)
PLATELETS: 221 10*3/uL (ref 150.0–400.0)
RBC: 4.32 Mil/uL (ref 3.87–5.11)
RDW: 13.9 % (ref 11.5–15.5)
WBC: 3.7 10*3/uL — ABNORMAL LOW (ref 4.0–10.5)

## 2015-12-28 LAB — LIPID PANEL
CHOLESTEROL: 216 mg/dL — AB (ref 0–200)
HDL: 50.1 mg/dL (ref >39.00)
LDL CALC: 130 mg/dL — AB (ref 0–99)
NonHDL: 165.52
TRIGLYCERIDES: 177 mg/dL — AB (ref 0.0–149.0)
Total CHOL/HDL Ratio: 4
VLDL: 35.4 mg/dL (ref 0.0–40.0)

## 2015-12-28 LAB — TSH: TSH: 1.87 u[IU]/mL (ref 0.35–4.50)

## 2015-12-28 LAB — HEMOGLOBIN A1C: Hgb A1c MFr Bld: 5.6 % (ref 4.6–6.5)

## 2015-12-29 ENCOUNTER — Telehealth: Payer: Self-pay | Admitting: Family Medicine

## 2015-12-29 LAB — HEPATITIS C ANTIBODY: HCV AB: NEGATIVE

## 2015-12-29 LAB — HIV ANTIBODY (ROUTINE TESTING W REFLEX): HIV 1&2 Ab, 4th Generation: NONREACTIVE

## 2015-12-29 NOTE — Telephone Encounter (Signed)
Spoke with patient reviewed lab results and instructions patient verbalized understanding. 

## 2015-12-29 NOTE — Telephone Encounter (Signed)
Left message for patient to return call to review lab results and instructions. 

## 2015-12-29 NOTE — Telephone Encounter (Signed)
Please call pt: - her labs are all normal with the exception of very mildly elevated cholesterol. - Diet low in saturated fat and sugar, higher fiber and more exercise (>150 minutes a week or more).  - Start fish oil supplement if not using already, at least 1-3g daily.   Results for orders placed or performed in visit on 12/28/15 (from the past 72 hour(s))  HIV antibody (with reflex)     Status: None   Collection Time: 12/28/15  9:28 AM  Result Value Ref Range   HIV 1&2 Ab, 4th Generation NONREACTIVE NONREACTIVE    Comment:   HIV-1 antigen and HIV-1/HIV-2 antibodies were not detected.  There is no laboratory evidence of HIV infection.   HIV-1/2 Antibody Diff        Not indicated. HIV-1 RNA, Qual TMA          Not indicated.     PLEASE NOTE: This information has been disclosed to you from records whose confidentiality may be protected by state law. If your state requires such protection, then the state law prohibits you from making any further disclosure of the information without the specific written consent of the person to whom it pertains, or as otherwise permitted by law. A general authorization for the release of medical or other information is NOT sufficient for this purpose.   The performance of this assay has not been clinically validated in patients less than 48 years old.   For additional information please refer to http://education.[facility].com/faq/FAQ106.  (This link is being provided for informational/educational purposes only.)     Hepatitis C Antibody     Status: None   Collection Time: 12/28/15  9:28 AM  Result Value Ref Range   HCV Ab NEGATIVE NEGATIVE  CBC w/Diff     Status: Abnormal   Collection Time: 12/28/15  9:28 AM  Result Value Ref Range   WBC 3.7 (L) 4.0 - 10.5 K/uL   RBC 4.32 3.87 - 5.11 Mil/uL   Hemoglobin 13.4 12.0 - 15.0 g/dL   HCT 16.1 09.6 - 04.5 %   MCV 91.8 78.0 - 100.0 fl   MCHC 33.8 30.0 - 36.0 g/dL   RDW 40.9 81.1 - 91.4 %   Platelets 221.0 150.0 - 400.0 K/uL   Neutrophils Relative % 47.4 43.0 - 77.0 %   Lymphocytes Relative 41.4 12.0 - 46.0 %   Monocytes Relative 8.1 3.0 - 12.0 %   Eosinophils Relative 2.6 0.0 - 5.0 %   Basophils Relative 0.5 0.0 - 3.0 %   Neutro Abs 1.7 1.4 - 7.7 K/uL   Lymphs Abs 1.5 0.7 - 4.0 K/uL   Monocytes Absolute 0.3 0.1 - 1.0 K/uL   Eosinophils Absolute 0.1 0.0 - 0.7 K/uL   Basophils Absolute 0.0 0.0 - 0.1 K/uL  Comprehensive metabolic panel     Status: None   Collection Time: 12/28/15  9:28 AM  Result Value Ref Range   Sodium 141 135 - 145 mEq/L   Potassium 4.5 3.5 - 5.1 mEq/L   Chloride 105 96 - 112 mEq/L   CO2 30 19 - 32 mEq/L   Glucose, Bld 88 70 - 99 mg/dL   BUN 22 6 - 23 mg/dL   Creatinine, Ser 7.82 0.40 - 1.20 mg/dL   Total Bilirubin 0.6 0.2 - 1.2 mg/dL   Alkaline Phosphatase 61 39 - 117 U/L   AST 16 0 - 37 U/L   ALT 10 0 - 35 U/L   Total Protein 6.7 6.0 -  8.3 g/dL   Albumin 4.4 3.5 - 5.2 g/dL   Calcium 9.9 8.4 - 16.110.5 mg/dL   GFR 09.6069.07 >45.40>60.00 mL/min  Lipid panel     Status: Abnormal   Collection Time: 12/28/15  9:28 AM  Result Value Ref Range   Cholesterol 216 (H) 0 - 200 mg/dL    Comment: ATP III Classification       Desirable:  < 200 mg/dL               Borderline High:  200 - 239 mg/dL          High:  > = 981240 mg/dL   Triglycerides 191.4177.0 (H) 0.0 - 149.0 mg/dL    Comment: Normal:  <782<150 mg/dLBorderline High:  150 - 199 mg/dL   HDL 95.6250.10 >13.08>39.00 mg/dL   VLDL 65.735.4 0.0 - 84.640.0 mg/dL   LDL Cholesterol 962130 (H) 0 - 99 mg/dL   Total CHOL/HDL Ratio 4     Comment:                Men          Women1/2 Average Risk     3.4          3.3Average Risk          5.0          4.42X Average Risk          9.6          7.13X Average Risk          15.0          11.0                       NonHDL 165.52     Comment: NOTE:  Non-HDL goal should be 30 mg/dL higher than patient's LDL goal (i.e. LDL goal of < 70 mg/dL, would have non-HDL goal of < 100 mg/dL)  TSH     Status: None    Collection Time: 12/28/15  9:28 AM  Result Value Ref Range   TSH 1.87 0.35 - 4.50 uIU/mL  HgB A1c     Status: None   Collection Time: 12/28/15  9:28 AM  Result Value Ref Range   Hgb A1c MFr Bld 5.6 4.6 - 6.5 %    Comment: Glycemic Control Guidelines for People with Diabetes:Non Diabetic:  <6%Goal of Therapy: <7%Additional Action Suggested:  >8%

## 2016-01-06 ENCOUNTER — Encounter: Payer: Self-pay | Admitting: Family Medicine

## 2016-02-01 ENCOUNTER — Telehealth: Payer: Self-pay | Admitting: Family Medicine

## 2016-02-01 NOTE — Telephone Encounter (Signed)
Please call pt and inform her Dr. Donavan BurnetBuccini's office has been attempting to contact her to schedule appt for her follow colonoscopy. They have not be able to reach her. She should call their office to schedule if not done so already. [phone].

## 2016-02-02 NOTE — Telephone Encounter (Signed)
Left message on patient voice mail to contact Dr Buccini's office to schedule her follow up colonoscopy.

## 2016-12-27 ENCOUNTER — Encounter: Payer: 59 | Admitting: Family Medicine
